# Patient Record
Sex: Female | Born: 1990 | Race: White | Hispanic: No | Marital: Single
Health system: Southern US, Community
[De-identification: ages and names within clinical notes are randomized; demographics above are authoritative.]

---
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
## 2010-06-09 LAB — CBC
HCT: 35.8 % — ABNORMAL LOW (ref 36.0–46.0)
HCT: 36.2 % (ref 36.0–46.0)
HCT: 41.3 % (ref 36.0–46.0)
Hemoglobin: 12.6 g/dL (ref 12.0–15.0)
MCHC: 34.5 g/dL (ref 30.0–36.0)
MCV: 82.4 fL (ref 78.0–100.0)
MCV: 82.4 fL (ref 78.0–100.0)
MCV: 82.4 fL (ref 78.0–100.0)
Platelets: 257 10*3/uL (ref 150–400)
Platelets: 267 10*3/uL (ref 150–400)
Platelets: 271 10*3/uL (ref 150–400)
Platelets: 292 10*3/uL (ref 150–400)
Platelets: 302 10*3/uL (ref 150–400)
RBC: 4.39 MIL/uL (ref 3.87–5.11)
RBC: 4.59 MIL/uL (ref 3.87–5.11)
RBC: 5.08 MIL/uL (ref 3.87–5.11)
RDW: 13.5 % (ref 11.5–15.5)
WBC: 12.1 10*3/uL — ABNORMAL HIGH (ref 4.0–10.5)
WBC: 12.3 10*3/uL — ABNORMAL HIGH (ref 4.0–10.5)
WBC: 12.7 10*3/uL — ABNORMAL HIGH (ref 4.0–10.5)
WBC: 17.9 10*3/uL — ABNORMAL HIGH (ref 4.0–10.5)

## 2010-06-09 LAB — BASIC METABOLIC PANEL
BUN: 8 mg/dL (ref 6–23)
CO2: 24 mEq/L (ref 19–32)
CO2: 27 mEq/L (ref 19–32)
Calcium: 8.8 mg/dL (ref 8.4–10.5)
Chloride: 105 mEq/L (ref 96–112)
Chloride: 107 mEq/L (ref 96–112)
Creatinine, Ser: 0.8 mg/dL (ref 0.4–1.2)
Creatinine, Ser: 0.8 mg/dL (ref 0.4–1.2)
Glucose, Bld: 79 mg/dL (ref 70–99)
Glucose, Bld: 94 mg/dL (ref 70–99)
Potassium: 4.3 mEq/L (ref 3.5–5.1)
Potassium: 4.6 mEq/L (ref 3.5–5.1)

## 2010-06-09 LAB — DIFFERENTIAL
Eosinophils Absolute: 0.2 10*3/uL (ref 0.0–0.7)
Eosinophils Relative: 1 % (ref 0–5)
Metamyelocytes Relative: 0 %
Monocytes Absolute: 1.3 10*3/uL — ABNORMAL HIGH (ref 0.1–1.0)
Monocytes Relative: 7 % (ref 3–12)
Neutro Abs: 14.4 10*3/uL — ABNORMAL HIGH (ref 1.7–7.7)
nRBC: 0 /100 WBC

## 2010-06-09 LAB — CULTURE, BLOOD (ROUTINE X 2): Culture: NO GROWTH

## 2014-06-05 ENCOUNTER — Other Ambulatory Visit: Payer: Self-pay | Admitting: Ophthalmology

## 2014-06-05 DIAGNOSIS — H53123 Transient visual loss, bilateral: Secondary | ICD-10-CM

## 2014-06-19 ENCOUNTER — Other Ambulatory Visit: Payer: Self-pay

## 2014-06-19 ENCOUNTER — Inpatient Hospital Stay: Admission: RE | Admit: 2014-06-19 | Payer: Self-pay | Source: Ambulatory Visit

## 2014-06-22 ENCOUNTER — Other Ambulatory Visit: Payer: Self-pay

## 2014-09-02 ENCOUNTER — Ambulatory Visit
Admission: RE | Admit: 2014-09-02 | Discharge: 2014-09-02 | Disposition: A | Discharge status: Home / Self Care | Payer: BC Managed Care – PPO | Source: Ambulatory Visit | Attending: Ophthalmology | Admitting: Ophthalmology

## 2014-09-02 DIAGNOSIS — H53123 Transient visual loss, bilateral: Secondary | ICD-10-CM

## 2014-09-02 MED ORDER — GADOBENATE DIMEGLUMINE 529 MG/ML IV SOLN
20.0000 mL | Freq: Once | INTRAVENOUS | Status: AC | PRN
  Administered 2014-09-02: 20 mL via INTRAVENOUS

## 2014-09-05 ENCOUNTER — Other Ambulatory Visit: Payer: Self-pay | Admitting: Ophthalmology

## 2014-09-05 ENCOUNTER — Other Ambulatory Visit: Payer: Self-pay

## 2014-09-05 DIAGNOSIS — H4710 Unspecified papilledema: Secondary | ICD-10-CM

## 2014-09-05 DIAGNOSIS — G9389 Other specified disorders of brain: Principal | ICD-10-CM

## 2014-09-05 DIAGNOSIS — G96810 Intracranial hypotension, unspecified: Secondary | ICD-10-CM

## 2014-09-21 ENCOUNTER — Other Ambulatory Visit: Payer: Self-pay | Admitting: Ophthalmology

## 2014-09-21 ENCOUNTER — Ambulatory Visit
Admission: RE | Admit: 2014-09-21 | Discharge: 2014-09-21 | Disposition: A | Discharge status: Home / Self Care | Payer: BC Managed Care – PPO | Source: Ambulatory Visit | Attending: Ophthalmology | Admitting: Ophthalmology

## 2014-09-21 DIAGNOSIS — G96810 Intracranial hypotension, unspecified: Secondary | ICD-10-CM

## 2014-09-21 DIAGNOSIS — H4710 Unspecified papilledema: Secondary | ICD-10-CM

## 2014-09-21 DIAGNOSIS — G9389 Other specified disorders of brain: Principal | ICD-10-CM

## 2014-09-21 LAB — GRAM STAIN
Gram Stain: NONE SEEN
Gram Stain: NONE SEEN

## 2014-09-21 NOTE — Discharge Instructions (Signed)

## 2016-02-20 IMAGING — XA DG FLUORO GUIDE LUMBAR PUNCTURE
1 series · 1 of 1 positions shown · non-contrast
Comparison: none

CLINICAL DATA: Episodes of blurred vision.

[Series 1: ortho standard · 1 of 1 slices shown]
[im 1/1]
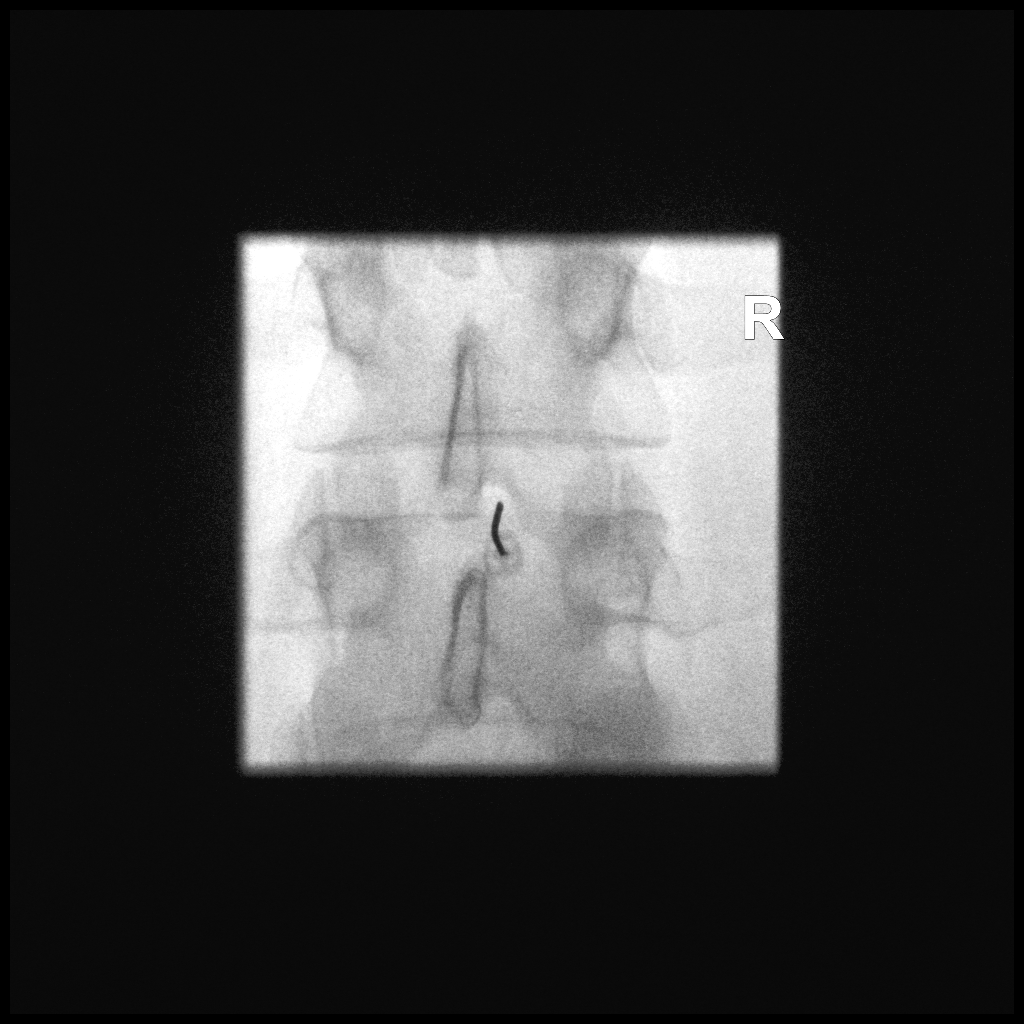

[1 of 1 positions shown; findings below may reference images not displayed]

EXAM:
DIAGNOSTIC LUMBAR PUNCTURE UNDER FLUOROSCOPIC GUIDANCE

FLUOROSCOPY TIME:  Radiation Exposure Index (as provided by the
fluoroscopic device): 0 minutes 13 seconds. 50.3 micro gray meter
squared

PROCEDURE:
Informed consent was obtained from the patient prior to the
procedure, including potential complications of headache, allergy,
and pain. With the patient prone, the lower back was prepped with
Betadine. 1% Lidocaine was used for local anesthesia. Lumbar
puncture was performed at the right L3-4 level using a 6 inch 20
gauge needle with return of clear CSF with an opening pressure of 16
cm water, measured in the lateral decubitus position. Seven ml of
CSF were obtained for laboratory studies. The patient tolerated the
procedure well and there were no apparent complications.
IMPRESSION: Lumbar puncture on the right at L3-4.  Opening pressure 16 cm water.
